# Patient Record
Sex: Female | Born: 1975 | Race: White | Hispanic: No | Marital: Married
Health system: Southern US, Community
[De-identification: ages and names within clinical notes are randomized; demographics above are authoritative.]

## PROBLEM LIST (undated history)

## (undated) DIAGNOSIS — N200 Calculus of kidney: Secondary | ICD-10-CM

## (undated) HISTORY — PX: LITHOTRIPSY: SUR834

## (undated) HISTORY — PX: TUBAL LIGATION: SHX77

---

## 2017-05-05 ENCOUNTER — Emergency Department (HOSPITAL_COMMUNITY)

## 2017-05-05 ENCOUNTER — Emergency Department (HOSPITAL_COMMUNITY)
Admission: EM | Admit: 2017-05-05 | Discharge: 2017-05-05 | Disposition: A | Discharge status: Home / Self Care | Attending: Emergency Medicine | Admitting: Emergency Medicine

## 2017-05-05 ENCOUNTER — Encounter (HOSPITAL_COMMUNITY): Payer: Self-pay | Admitting: *Deleted

## 2017-05-05 DIAGNOSIS — Y998 Other external cause status: Secondary | ICD-10-CM | POA: Insufficient documentation

## 2017-05-05 DIAGNOSIS — M5489 Other dorsalgia: Secondary | ICD-10-CM | POA: Diagnosis not present

## 2017-05-05 DIAGNOSIS — S29012A Strain of muscle and tendon of back wall of thorax, initial encounter: Secondary | ICD-10-CM | POA: Insufficient documentation

## 2017-05-05 DIAGNOSIS — R110 Nausea: Secondary | ICD-10-CM | POA: Diagnosis not present

## 2017-05-05 DIAGNOSIS — R109 Unspecified abdominal pain: Secondary | ICD-10-CM

## 2017-05-05 DIAGNOSIS — R1084 Generalized abdominal pain: Secondary | ICD-10-CM | POA: Diagnosis not present

## 2017-05-05 DIAGNOSIS — Y33XXXA Other specified events, undetermined intent, initial encounter: Secondary | ICD-10-CM | POA: Diagnosis not present

## 2017-05-05 DIAGNOSIS — S39012A Strain of muscle, fascia and tendon of lower back, initial encounter: Secondary | ICD-10-CM

## 2017-05-05 DIAGNOSIS — Y939 Activity, unspecified: Secondary | ICD-10-CM | POA: Insufficient documentation

## 2017-05-05 DIAGNOSIS — Y929 Unspecified place or not applicable: Secondary | ICD-10-CM | POA: Insufficient documentation

## 2017-05-05 DIAGNOSIS — R17 Unspecified jaundice: Secondary | ICD-10-CM | POA: Insufficient documentation

## 2017-05-05 DIAGNOSIS — M546 Pain in thoracic spine: Secondary | ICD-10-CM

## 2017-05-05 HISTORY — DX: Calculus of kidney: N20.0

## 2017-05-05 LAB — CBC WITH DIFFERENTIAL/PLATELET
BASOS PCT: 0 %
Basophils Absolute: 0 10*3/uL (ref 0.0–0.1)
EOS ABS: 0.1 10*3/uL (ref 0.0–0.7)
Eosinophils Relative: 1 %
HCT: 38 % (ref 36.0–46.0)
Hemoglobin: 12.7 g/dL (ref 12.0–15.0)
Lymphocytes Relative: 29 %
Lymphs Abs: 2.6 10*3/uL (ref 0.7–4.0)
MCH: 30.2 pg (ref 26.0–34.0)
MCHC: 33.4 g/dL (ref 30.0–36.0)
MCV: 90.3 fL (ref 78.0–100.0)
MONOS PCT: 7 %
Monocytes Absolute: 0.6 10*3/uL (ref 0.1–1.0)
NEUTROS PCT: 63 %
Neutro Abs: 5.7 10*3/uL (ref 1.7–7.7)
Platelets: 353 10*3/uL (ref 150–400)
RBC: 4.21 MIL/uL (ref 3.87–5.11)
RDW: 13.2 % (ref 11.5–15.5)
WBC: 9.1 10*3/uL (ref 4.0–10.5)

## 2017-05-05 LAB — LIPASE, BLOOD: Lipase: 21 U/L (ref 11–51)

## 2017-05-05 LAB — URINALYSIS, ROUTINE W REFLEX MICROSCOPIC
BILIRUBIN URINE: NEGATIVE
GLUCOSE, UA: NEGATIVE mg/dL
Hgb urine dipstick: NEGATIVE
KETONES UR: NEGATIVE mg/dL
Leukocytes, UA: NEGATIVE
NITRITE: NEGATIVE
PH: 7 (ref 5.0–8.0)
Protein, ur: NEGATIVE mg/dL
SPECIFIC GRAVITY, URINE: 1.001 — AB (ref 1.005–1.030)

## 2017-05-05 LAB — COMPREHENSIVE METABOLIC PANEL
ALBUMIN: 3.9 g/dL (ref 3.5–5.0)
ALK PHOS: 90 U/L (ref 38–126)
ALT: 17 U/L (ref 14–54)
ANION GAP: 10 (ref 5–15)
AST: 16 U/L (ref 15–41)
BUN: 8 mg/dL (ref 6–20)
CO2: 25 mmol/L (ref 22–32)
Calcium: 9.2 mg/dL (ref 8.9–10.3)
Chloride: 102 mmol/L (ref 101–111)
Creatinine, Ser: 0.77 mg/dL (ref 0.44–1.00)
GFR calc Af Amer: >60 mL/min (ref >60)
GFR calc non Af Amer: >60 mL/min (ref >60)
GLUCOSE: 89 mg/dL (ref 65–99)
POTASSIUM: 3.5 mmol/L (ref 3.5–5.1)
SODIUM: 137 mmol/L (ref 135–145)
Total Bilirubin: 1.7 mg/dL — ABNORMAL HIGH (ref 0.3–1.2)
Total Protein: 7.2 g/dL (ref 6.5–8.1)

## 2017-05-05 LAB — PREGNANCY, URINE: Preg Test, Ur: NEGATIVE

## 2017-05-05 MED ORDER — NAPROXEN 500 MG PO TABS: 500.0000 mg | ORAL_TABLET | Freq: Two times a day (BID) | ORAL | 0 refills | Status: AC | PRN

## 2017-05-05 MED ORDER — ONDANSETRON HCL 4 MG/2ML IJ SOLN
4.0000 mg | Freq: Once | INTRAMUSCULAR | Status: AC
  Administered 2017-05-05: 4 mg via INTRAVENOUS
  Filled 2017-05-05: qty 2

## 2017-05-05 MED ORDER — KETOROLAC TROMETHAMINE 30 MG/ML IJ SOLN
30.0000 mg | Freq: Once | INTRAMUSCULAR | Status: AC
  Administered 2017-05-05: 30 mg via INTRAVENOUS
  Filled 2017-05-05: qty 1

## 2017-05-05 MED ORDER — ONDANSETRON 4 MG PO TBDP: 4.0000 mg | ORAL_TABLET | Freq: Three times a day (TID) | ORAL | 0 refills | Status: AC | PRN

## 2017-05-05 MED ORDER — SODIUM CHLORIDE 0.9 % IV BOLUS (SEPSIS)
1000.0000 mL | Freq: Once | INTRAVENOUS | Status: AC
  Administered 2017-05-05: 1000 mL via INTRAVENOUS

## 2017-05-05 NOTE — ED Notes (Signed)
Declined W/C at D/C and was escorted to lobby by RN. 

## 2017-05-05 NOTE — ED Triage Notes (Signed)
To ED for eval of right flank pain for the past 3 days. States feels similar to past kidney stones however pain was gradual this time and hurts more with stretching. Appears in nad.

## 2017-05-05 NOTE — Discharge Instructions (Signed)
Take naprosyn as directed as needed for pain using tylenol for breakthrough pain. Use Zofran as needed for nausea. Use heat to the area of soreness, no more than 20 minutes per hour. Avoid any heavy lifting or straining/bending for the next 2 weeks. Follow up with your regular doctor in 5-7 days for recheck of symptoms. Return to the ER for emergent changes or worsening symptoms.

## 2017-05-05 NOTE — ED Provider Notes (Signed)
[facility] EMERGENCY DEPARTMENT Provider Note   CSN: [id number] Arrival date & time: 05/05/17  4098     History   Chief Complaint Chief Complaint  Patient presents with  . Flank Pain    HPI Phyllis Mclaughlin is a 41 y.o. female with a PMHx of nephrolithiasis and depression, with a PSHx of tubal ligation and lithotri[place] ([place]yrs [place]), who presents to the ED with complaints of gradual onset gradually worsening left flank pain 2 days. She states this feels somewhat similar to her prior kidney stones, which she states required lithotripsy 12 years ago. She has not seen a urologist in the state of [place]. She states that her kidney stones have been "in the mouth of the kidney". She describes her pain today is 9/10 constant sharp left flank pain radiating to the LUQ area, worse with movement and standing, and unrelieved with Tylenol, ibuprofen, and essential oil rub. She reports associated nausea. She denies any recent heavy lifting or twisting, although she has done some mild yoga recently however nothing strenuous. Her PCP is Haskel Schroeder at [facility].   She denies fevers, chills, CP, SOB, abd pain, vomiting, diarrhea/constipation, obstipation, melena, hematochezia, hematuria, dysuria, vaginal bleeding/discharge, myalgias, arthralgias, numbness, tingling, focal weakness, or any other complaints at this time. Denies recent travel, sick contacts, suspicious food intake, EtOH use, or frequent NSAID use.   The history is provided by the patient and medical records. No language interpreter was used.  Flank Pain  This is a new problem. The current episode started 2 days ago. The problem occurs constantly. The problem has not changed since onset.Pertinent negatives include no chest pain, no abdominal pain and no shortness of breath. The symptoms are aggravated by walking and twisting. Nothing relieves the symptoms. She has tried acetaminophen (and  ibuprofen and essential oil rub) for the symptoms. The treatment provided no relief.    Past Medical History:  Diagnosis Date  . Kidney stones     There are no active problems to display for this patient.   Past Surgical History:  Procedure Laterality Date  . LITHOTRIPSY    . TUBAL LIGATION      OB History    No data available       Home Medications    Prior to Admission medications   Not on File    Family History No family history on file.  Social History Social History  Substance Use Topics  . Smoking status: Not on file  . Smokeless tobacco: Not on file  . Alcohol use Not on file     Allergies   Amoxicillin; Penicillins; and Sulfa antibiotics   Review of Systems Review of Systems  Constitutional: Negative for chills and fever.  Respiratory: Negative for shortness of breath.   Cardiovascular: Negative for chest pain.  Gastrointestinal: Positive for nausea. Negative for abdominal pain, blood in stool, constipation, diarrhea and vomiting.  Genitourinary: Positive for flank pain. Negative for dysuria, hematuria, vaginal bleeding and vaginal discharge.  Musculoskeletal: Negative for arthralgias and myalgias.  Skin: Negative for color change.  Allergic/Immunologic: Negative for immunocompromised state.  Neurological: Negative for weakness and numbness.  Psychiatric/Behavioral: Negative for confusion.   All other systems reviewed and are negative for acute change except as noted in the HPI.    Physical Exam Updated Vital Signs BP (!) 144/97 (BP Location: Right Arm)   Pulse 83   Temp 98 F (36.7 C) (Oral)   Resp 16  LMP 03/27/2017   SpO2 100%   Physical Exam  Constitutional: She is oriented to person, place, and time. Vital signs are normal. She appears well-developed and well-nourished.  Non-toxic appearance. No distress.  Afebrile, nontoxic, NAD  HENT:  Head: Normocephalic and atraumatic.  Mouth/Throat: Oropharynx is clear and moist and mucous  membranes are normal.  Eyes: Conjunctivae and EOM are normal. Right eye exhibits no discharge. Left eye exhibits no discharge.  Neck: Normal range of motion. Neck supple.  Cardiovascular: Normal rate, regular rhythm, normal heart sounds and intact distal pulses.  Exam reveals no gallop and no friction rub.   No murmur heard. Pulmonary/Chest: Effort normal and breath sounds normal. No respiratory distress. She has no decreased breath sounds. She has no wheezes. She has no rhonchi. She has no rales.  Abdominal: Soft. Normal appearance and bowel sounds are normal. She exhibits no distension. There is no tenderness. There is CVA tenderness. There is no rigidity, no rebound, no guarding, no tenderness at McBurney's point and negative Murphy's sign.  Soft, NTND, +BS throughout, no r/g/r, neg murphy's, neg mcburney's, with mild L sided CVA TTP   Musculoskeletal: Normal range of motion.       Thoracic back: She exhibits tenderness. She exhibits normal range of motion, no bony tenderness and no spasm.       Back:  See abd exam above; mild L sided CVA TTP, no midline spinal TTP, no bony stepoffs or deformities, no paraspinous muscle spasms palpable.  MAE x4 Strength and sensation grossly intact in all extremities Distal pulses intact  Neurological: She is alert and oriented to person, place, and time. She has normal strength. No sensory deficit.  Skin: Skin is warm, dry and intact. No rash noted.  Psychiatric: She has a normal mood and affect.  Nursing note and vitals reviewed.    ED Treatments / Results  Labs (all labs ordered are listed, but only abnormal results are displayed) Labs Reviewed  URINALYSIS, ROUTINE W REFLEX MICROSCOPIC - Abnormal; Notable for the following:       Result Value   Color, Urine COLORLESS (*)    Specific Gravity, Urine 1.001 (*)    All other components within normal limits  COMPREHENSIVE METABOLIC PANEL - Abnormal; Notable for the following:    Total Bilirubin 1.7  (*)    All other components within normal limits  PREGNANCY, URINE  CBC WITH DIFFERENTIAL/PLATELET  LIPASE, BLOOD    EKG  EKG Interpretation None       Radiology Ct Renal Stone Study  Result Date: 05/05/2017 CLINICAL DATA:  Left flank pain for 1 week EXAM: CT ABDOMEN AND PELVIS WITHOUT CONTRAST TECHNIQUE: Multidetector CT imaging of the abdomen and pelvis was performed following the standard protocol without IV contrast. COMPARISON:  None. FINDINGS: Lower chest: Lung bases are clear. No effusions. Heart is normal size. Hepatobiliary: Diffuse low-density throughout the liver compatible with mild fatty infiltration. No visible focal abnormality. Gallbladder unremarkable. Pancreas: No focal abnormality or ductal dilatation. Spleen: No focal abnormality.  Normal size. Adrenals/Urinary Tract: No adrenal abnormality. No focal renal abnormality. No renal stones or hydronephrosis. Urinary bladder is unremarkable. Calcification in the left pelvis is felt to represent phlebolith given the lack of hydronephrosis on the left. It is difficult to follow the distal left ureter. Stomach/Bowel: Stomach, large and small bowel grossly unremarkable. Appendix is normal. Vascular/Lymphatic: There are shotty retroperitoneal lymph nodes with mild haziness/ stranding in the retroperitoneum. Index left periaortic lymph node has a short axis diameter  of 6 mm. No pathologically enlarged lymph nodes. No inguinal adenopathy. Reproductive: Uterus and adnexa unremarkable.  No mass. Other: No free fluid or free air. Musculoskeletal: No acute bony abnormality. IMPRESSION: No visible renal or definite ureteral stones. No hydronephrosis. Calcification in the left side of the pelvis felt represent a phlebolith rather than a distal ureteral stone given the lack of hydronephrosis. The distal left ureter is difficult to follow. Small shotty retroperitoneal lymph nodes, none pathologically enlarged, likely reactive. Electronically Signed    By: Charlett Nose M.D.   On: 05/05/2017 12:55    Procedures Procedures (including critical care time)  Medications Ordered in ED Medications  ketorolac (TORADOL) 30 MG/ML injection 30 mg (30 mg Intravenous Given 05/05/17 1252)  ondansetron (ZOFRAN) injection 4 mg (4 mg Intravenous Given 05/05/17 1252)  sodium chloride 0.9 % bolus 1,000 mL (1,000 mLs Intravenous New Bag/Given 05/05/17 1252)     Initial Impression / Assessment and Plan / ED Course  I have reviewed the triage vital signs and the nursing notes.  Pertinent labs & imaging results that were available during my care of the patient were reviewed by me and considered in my medical decision making (see chart for details).     41 y.o. female here with L flank pain x2 days, states it feels similar to her prior kidney stones. Associated nausea. On exam, mild L flank TTP but no paraspinous muscle spasm palpable, no midline spinal tenderness, no abdominal tenderness. Afebrile and nontoxic. U/A unremarkable, Upreg neg. Will get labs and CT renal study, will give toradol/zofran/fluids and reassess shortly.   2:01 PM Pt feeling slightly better and tolerating PO well. CBC w/diff WNL. CMP with mildly elevated bili 1.7 but otherwise WNL. Lipase WNL. CT renal without definite kidney stone however 6mm calcification in L pelvis could be phlebolith given lack of hydronephrosis, however distal left ureter is hard to follow so it's possible it's a kidney stone; also with shotty reactive LAD. Not convincingly nephrolithiasis, could just be a muscle strain in her back from her recent Yoga, but still possible to be kidney stone. Will send home with zofran and naprosyn, advised use of tylenol as well. Advised staying hydrated. Discussed use of heat to the area of soreness, and avoidance of straining/heavy lifting for 2wks. Advised f/up with PCP in 5-7 days for recheck. I explained the diagnosis and have given explicit precautions to return to the ER  including for any other new or worsening symptoms. The patient understands and accepts the medical plan as it's been dictated and I have answered their questions. Discharge instructions concerning home care and prescriptions have been given. The patient is STABLE and is discharged to home in good condition.    Final Clinical Impressions(s) / ED Diagnoses   Final diagnoses:  Left flank pain  Nausea  Acute left-sided thoracic back pain  Back strain, initial encounter  Hyperbilirubinemia    New Prescriptions New Prescriptions   NAPROXEN (NAPROSYN) 500 MG TABLET    Take 1 tablet (500 mg total) by mouth 2 (two) times daily as needed for mild pain, moderate pain or headache (TAKE WITH MEALS.).   ONDANSETRON (ZOFRAN ODT) 4 MG DISINTEGRATING TABLET    Take 1 tablet (4 mg total) by mouth every 8 (eight) hours as needed for nausea or vomiting.     [address] [place], [place] 05/05/17 1402    Cathren Laine, MD 05/05/17 [id number]

## 2020-03-28 ENCOUNTER — Other Ambulatory Visit: Payer: Self-pay | Admitting: Family Medicine

## 2020-03-28 DIAGNOSIS — Z1231 Encounter for screening mammogram for malignant neoplasm of breast: Secondary | ICD-10-CM

## 2020-05-07 ENCOUNTER — Ambulatory Visit

## 2020-05-13 ENCOUNTER — Other Ambulatory Visit: Payer: Self-pay

## 2020-05-13 ENCOUNTER — Ambulatory Visit
Admission: RE | Admit: 2020-05-13 | Discharge: 2020-05-13 | Disposition: A | Discharge status: Home / Self Care | Payer: Self-pay | Source: Ambulatory Visit | Attending: Family Medicine | Admitting: Family Medicine

## 2020-05-13 DIAGNOSIS — Z1231 Encounter for screening mammogram for malignant neoplasm of breast: Secondary | ICD-10-CM

## 2021-07-14 IMAGING — MG DIGITAL SCREENING BILAT W/ TOMO W/ CAD
6 of 10 series · 6 of 30 positions shown · non-contrast
Comparison: None.

CLINICAL DATA: Screening.

EXAM:
DIGITAL SCREENING BILATERAL MAMMOGRAM WITH TOMO AND CAD

[L CC synth-2D]
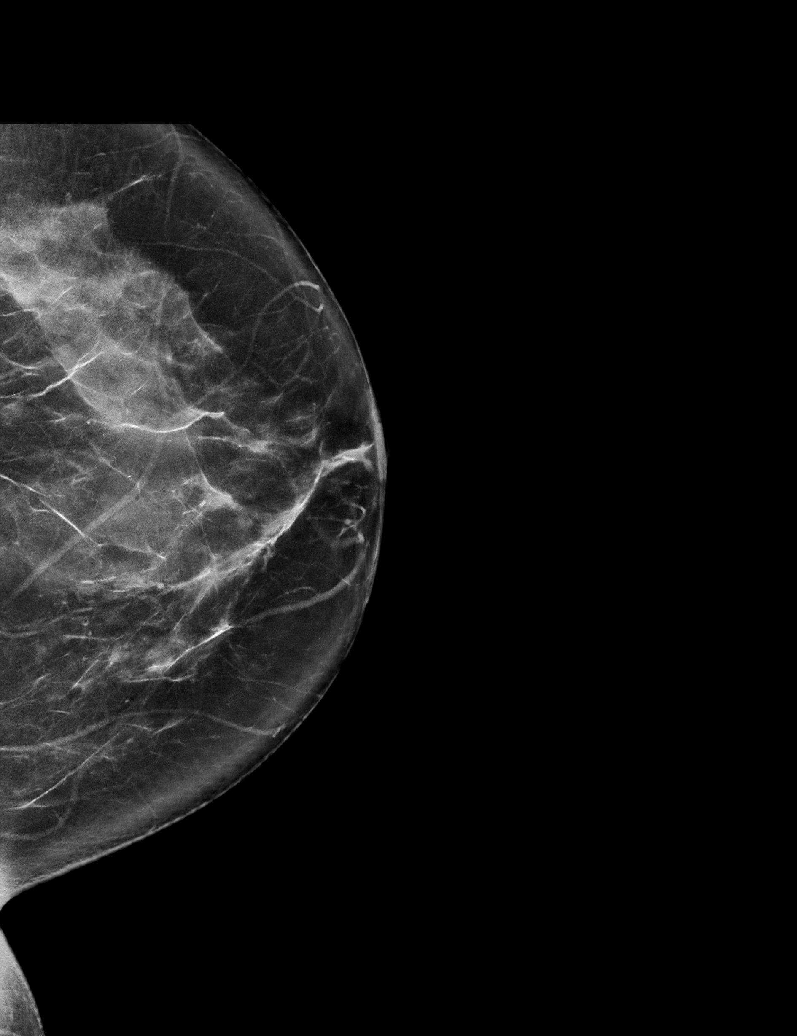

[L MLO synth-2D]
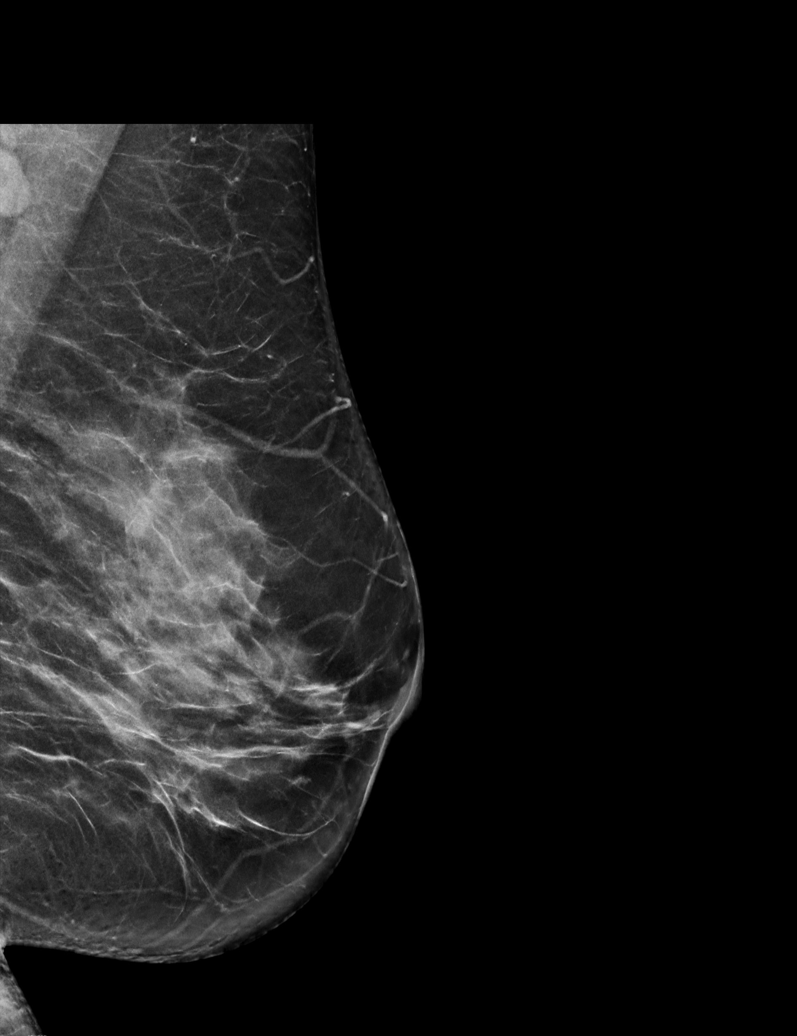

[R MLO synth-2D]
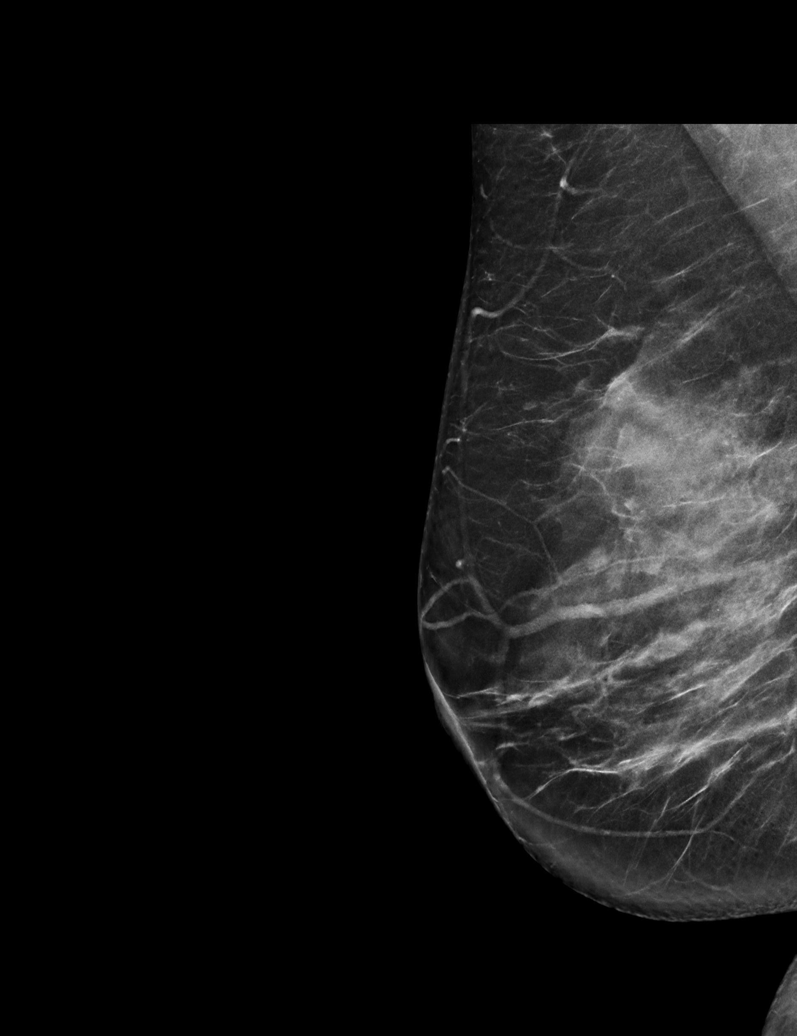

[R CC synth-2D (1 of 2)]
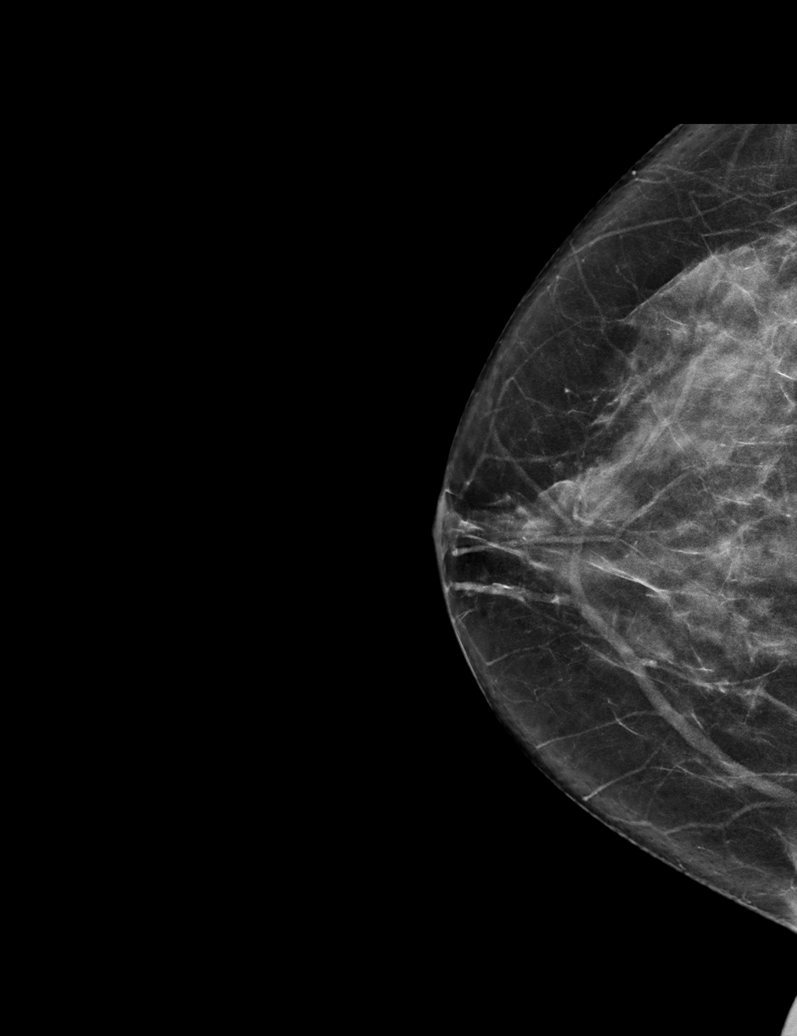

[R CC synth-2D (2 of 2)]
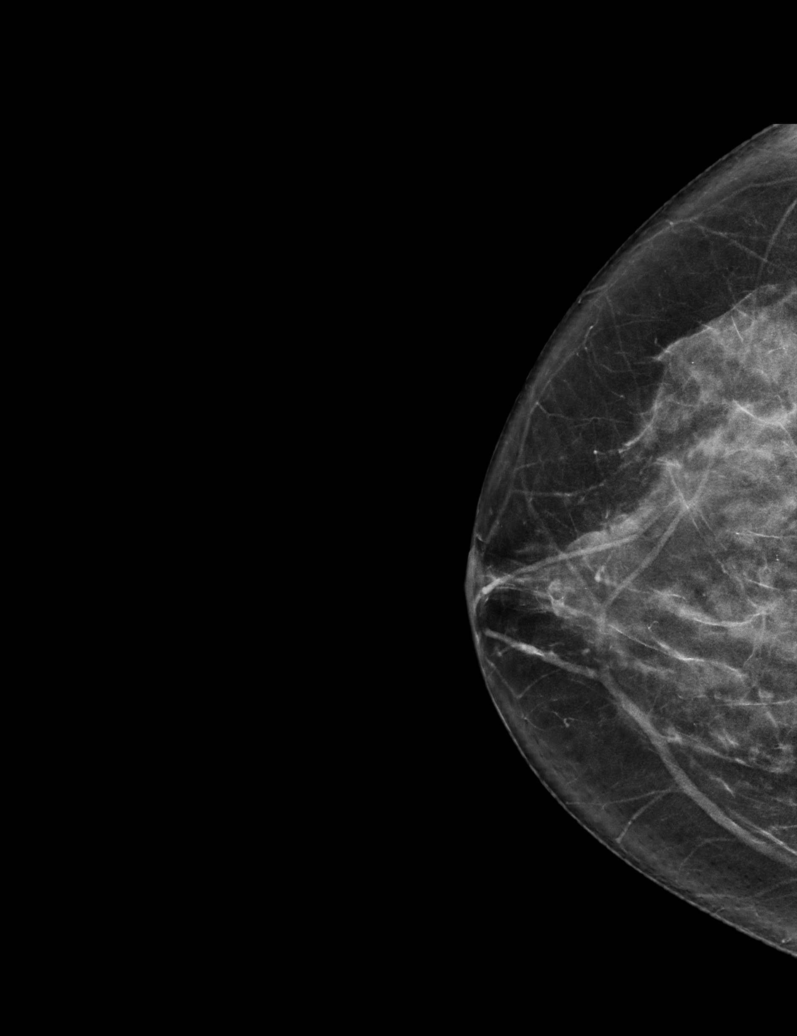

[R CC tomo · tomo slice 32/63.0]
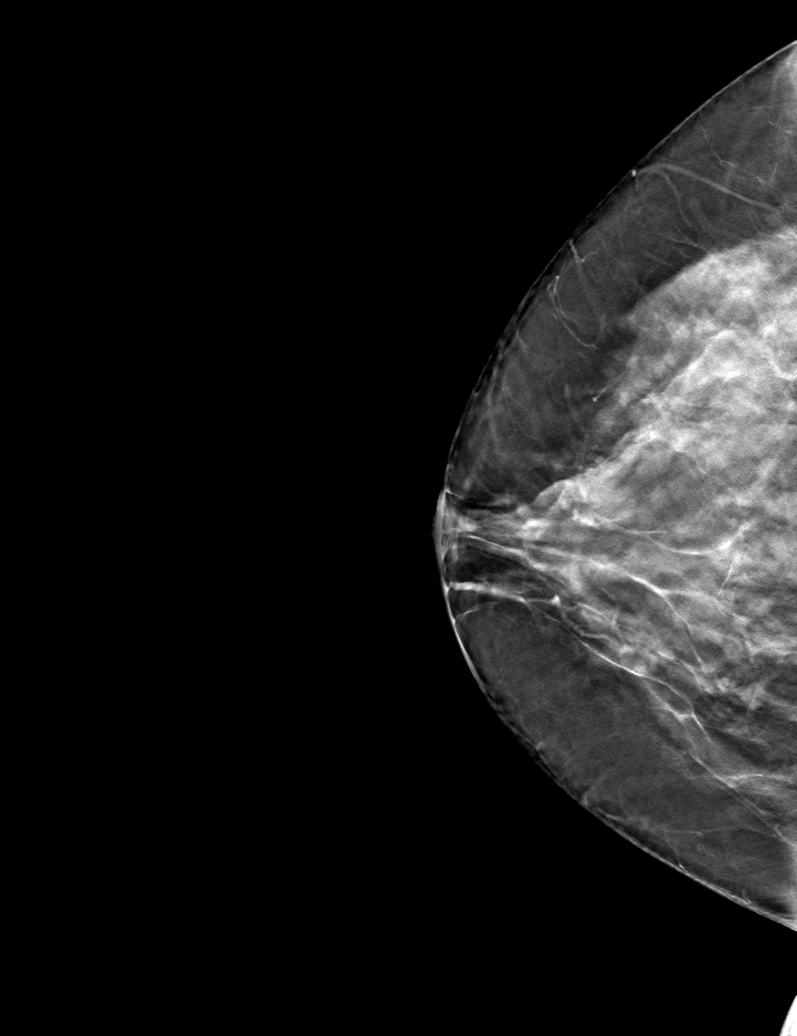

[6 of 30 positions shown; findings below may reference images not displayed]

ACR Breast Density Category c: The breast tissue is heterogeneously
dense, which may obscure small masses
FINDINGS: There are no findings suspicious for malignancy. Images were
processed with CAD.
IMPRESSION: No mammographic evidence of malignancy. A result letter of this
screening mammogram will be mailed directly to the patient.

RECOMMENDATION:
Screening mammogram in one year. (Code:EM-2-IHY)

BI-RADS CATEGORY  1: Negative.
